# Patient Record
Sex: Female | Born: 1975 | Race: White | Hispanic: No | Marital: Single | Smoking: Never smoker
Health system: Southern US, Community
[De-identification: ages and names within clinical notes are randomized; demographics above are authoritative.]

## PROBLEM LIST (undated history)

## (undated) DIAGNOSIS — F70 Mild intellectual disabilities: Secondary | ICD-10-CM

## (undated) DIAGNOSIS — J302 Other seasonal allergic rhinitis: Secondary | ICD-10-CM

## (undated) HISTORY — DX: Other seasonal allergic rhinitis: J30.2

## (undated) HISTORY — DX: Mild intellectual disabilities: F70

---

## 1998-10-30 ENCOUNTER — Other Ambulatory Visit: Admission: RE | Admit: 1998-10-30 | Discharge: 1998-10-30 | Payer: Self-pay | Admitting: Obstetrics and Gynecology

## 2000-01-09 ENCOUNTER — Other Ambulatory Visit: Admission: RE | Admit: 2000-01-09 | Discharge: 2000-01-09 | Payer: Self-pay | Admitting: Obstetrics and Gynecology

## 2001-04-07 ENCOUNTER — Other Ambulatory Visit: Admission: RE | Admit: 2001-04-07 | Discharge: 2001-04-07 | Payer: Self-pay | Admitting: Obstetrics and Gynecology

## 2002-04-28 ENCOUNTER — Other Ambulatory Visit: Admission: RE | Admit: 2002-04-28 | Discharge: 2002-04-28 | Payer: Self-pay | Admitting: Obstetrics and Gynecology

## 2003-05-04 ENCOUNTER — Other Ambulatory Visit: Admission: RE | Admit: 2003-05-04 | Discharge: 2003-05-04 | Payer: Self-pay | Admitting: Obstetrics and Gynecology

## 2004-05-13 ENCOUNTER — Ambulatory Visit: Payer: Self-pay | Admitting: Family Medicine

## 2004-05-15 ENCOUNTER — Other Ambulatory Visit: Admission: RE | Admit: 2004-05-15 | Discharge: 2004-05-15 | Payer: Self-pay | Admitting: Obstetrics and Gynecology

## 2004-07-08 ENCOUNTER — Ambulatory Visit: Payer: Self-pay | Admitting: Family Medicine

## 2004-08-29 ENCOUNTER — Ambulatory Visit: Payer: Self-pay | Admitting: Family Medicine

## 2004-10-03 ENCOUNTER — Ambulatory Visit: Payer: Self-pay | Admitting: Family Medicine

## 2005-07-24 ENCOUNTER — Ambulatory Visit: Payer: Self-pay | Admitting: Family Medicine

## 2005-12-16 ENCOUNTER — Ambulatory Visit: Payer: Self-pay | Admitting: Family Medicine

## 2006-04-06 ENCOUNTER — Ambulatory Visit: Payer: Self-pay | Admitting: Family Medicine

## 2008-03-21 ENCOUNTER — Ambulatory Visit: Payer: Self-pay | Admitting: Family Medicine

## 2008-03-21 DIAGNOSIS — F70 Mild intellectual disabilities: Secondary | ICD-10-CM | POA: Insufficient documentation

## 2008-08-25 ENCOUNTER — Telehealth: Payer: Self-pay | Admitting: Family Medicine

## 2009-10-26 ENCOUNTER — Ambulatory Visit: Payer: Self-pay | Admitting: Family Medicine

## 2009-10-26 LAB — CONVERTED CEMR LAB
Bilirubin Urine: NEGATIVE
Ketones, urine, test strip: NEGATIVE
Urobilinogen, UA: 0.2

## 2009-10-29 LAB — CONVERTED CEMR LAB
ALT: 9 units/L (ref 0–35)
CO2: 26 meq/L (ref 19–32)
Calcium: 9.1 mg/dL (ref 8.4–10.5)
Chloride: 105 meq/L (ref 96–112)
Cholesterol: 167 mg/dL (ref 0–200)
Creatinine, Ser: 0.72 mg/dL (ref 0.40–1.20)
Eosinophils Absolute: 0.2 10*3/uL (ref 0.0–0.7)
HCT: 36.8 % (ref 36.0–46.0)
Hemoglobin: 12.1 g/dL (ref 12.0–15.0)
Lymphs Abs: 2.1 10*3/uL (ref 0.7–4.0)
MCV: 92.7 fL (ref 78.0–100.0)
Monocytes Relative: 8 % (ref 3–12)
Neutrophils Relative %: 59 % (ref 43–77)
RBC: 3.97 M/uL (ref 3.87–5.11)
Sodium: 139 meq/L (ref 135–145)
Total Protein: 7 g/dL (ref 6.0–8.3)
WBC: 6.8 10*3/uL (ref 4.0–10.5)

## 2010-08-01 NOTE — Letter (Signed)
Summary: Physical Exam Form/[facility]  Physical Exam Form/[facility]   Imported By: Lanelle Bal 10/31/2009 12:37:33  _____________________________________________________________________  External Attachment:    Type:   Image     Comment:   External Document

## 2010-08-01 NOTE — Assessment & Plan Note (Signed)
Summary: YEARLY PHYSICAL FOR CAMP FORM TO BE FILLED OUT/[facility]   Vital Signs:  Patient profile:   35 year old female Height:      63.75 inches Weight:      167.75 pounds BMI:     29.13 Temp:     97.8 degrees F oral Pulse rate:   80 / minute Pulse rhythm:   regular BP sitting:   110 / 76  (left arm) Cuff size:   regular  Vitals Entered By: Lewanda Rife LPN (October 26, 2009 2:40 PM) CC: yearly physical for camp form   History of Present Illness: is doing well overall   still going to [employer]-- is taking some math and reading classes  in prep for eventually getting a job   has been feeling good   not enough exercise- needs to do that   made some changes in diet   wt is up 4 lb  bp good   will be doing a camp this summer - swimming and socializing   periods are a little heavy - not too painful  very regular  not sexually active- has never had gyn exam and is not sure she wants one was married for a short time - is divorced   Allergies (verified): No Known Drug Allergies  Past History:  Past Medical History: Last updated: 03/21/2008 mental retardation- mild  seasonal allergic rhinitis   Social History: Last updated: 10/26/2009 currently lives with mother  non smoker and no alcohol was briefly married  does exercise regularly  student at [employer]  Family History: HTN in family  Social History: currently lives with mother  non smoker and no alcohol was briefly married  does exercise regularly  student at [employer]  Review of Systems General:  Denies fatigue, loss of appetite, and malaise. Eyes:  Denies blurring and eye irritation. CV:  Denies chest pain or discomfort, palpitations, shortness of breath with exertion, and swelling of feet. Resp:  Denies cough and wheezing. GI:  Denies abdominal pain, change in bowel habits, indigestion, loss of appetite, and nausea. GU:  Denies abnormal vaginal bleeding, discharge, dysuria, and urinary frequency. MS:  Denies joint  pain, joint redness, joint swelling, muscle aches, and muscle weakness. Derm:  Denies itching, lesion(s), poor wound healing, and rash. Neuro:  Denies numbness and tingling. Psych:  mood has been fine. Endo:  Denies cold intolerance, excessive thirst, excessive urination, and heat intolerance. Heme:  Denies abnormal bruising, bleeding, and enlarge lymph nodes.  Physical Exam  General:  overweight but generally well appearing  Head:  normocephalic, atraumatic, and no abnormalities observed.   Eyes:  vision grossly intact, pupils equal, pupils round, and pupils reactive to light.  no conjunctival pallor, injection or icterus  Ears:  R ear normal and L ear normal.  - scant cerumen  Nose:  no nasal discharge.   Mouth:  pharynx pink and moist.   Neck:  supple with full rom and no masses or thyromegally, no JVD or carotid bruit  Chest Wall:  No deformities, masses, or tenderness noted. Lungs:  Normal respiratory effort, chest expands symmetrically. Lungs are clear to auscultation, no crackles or wheezes. Heart:  Normal rate and regular rhythm. S1 and S2 normal without gallop, murmur, click, rub or other extra sounds. Abdomen:  Bowel sounds positive,abdomen soft and non-tender without masses, organomegaly or hernias noted. Msk:  No deformity or scoliosis noted of thoracic or lumbar spine.  no acute joint changes  Pulses:  R and L carotid,radial,femoral,dorsalis pedis  and posterior tibial pulses are full and equal bilaterally Extremities:  No clubbing, cyanosis, edema, or deformity noted with normal full range of motion of all joints.   Neurologic:  sensation intact to light touch, gait normal, and DTRs symmetrical and normal.  no tremor  Skin:  Intact without suspicious lesions or rashes few skin tags and brown nevi fair complexion Cervical Nodes:  No lymphadenopathy noted Inguinal Nodes:  No significant adenopathy Psych:  baseline MR  somewhat blunted affect which is her baseline  seems to  be feeling fine    Impression & Recommendations:  Problem # 1:  HEALTH MAINTENANCE EXAM (ICD-V70.0) Assessment Comment Only here with form to fill out for camp  no restrictions anticipated  pend labs - wellness (and hb needed for form) also ua nl (some blood from menses )  disc health habits - could benefit from wt loss  adv 20 min of aerobic exercise at least 5 d per week  I do recommend coming back for gyn andbreast exam and pap - pt has never had one -- she is nervous about this and will think about it  she was married in past- no longer sexually active and no gyn c/o  Orders: Venipuncture (40981) Specimen Handling (19147) T-Comprehensive Metabolic Panel [phone]) T-CBC w/Diff ([id number]) T-TSH (52841-32440) T-Lipid Profile ([id number])  Problem # 2:  SCREENING FOR LIPOID DISORDERS (ICD-V77.91) Assessment: New  will check nonfasting lipid prof today diet is fair-could probably be better  disc sat fats to avoie   Orders: Specimen Handling (40347) T-Comprehensive Metabolic Panel ([id number]) T-CBC w/Diff ([id number]) T-TSH ([id number]) T-Lipid Profile ([id number])  Patient Instructions: 1)  we will do some wellness labs today 2)  start some exercise at least 5 days per week for 20 minutes  3)  I will return your form to you when your labs come back  4)  you should have a gynecologic exam and pap smear in the future- call back to schedule a visit for that   Prior Medications (reviewed today): None Current Allergies (reviewed today): No known allergies  Laboratory Results   Urine Tests  Date/Time Received: October 26, 2009 2:41 PM  Date/Time Reported: October 26, 2009 2:41 PM   Routine Urinalysis   Color: yellow Appearance: Hazy Glucose: negative   (Normal Range: Negative) Bilirubin: negative   (Normal Range: Negative) Ketone: negative   (Normal Range: Negative) Spec. Gravity: 1.020   (Normal Range: 1.003-1.035) Blood: large   (Normal Range:  Negative) pH: 6.0   (Normal Range: 5.0-8.0) Protein: trace   (Normal Range: Negative) Urobilinogen: 0.2   (Normal Range: 0-1) Nitrite: negative   (Normal Range: Negative) Leukocyte Esterace: negative   (Normal Range: Negative)    Comments: Pt is on menstrual period      Tetanus/Td Immunization History:    Tetanus/Td # 1:  Td (03/21/2008)  Influenza Immunization History:    Influenza # 1:  Fluvax 3+ (03/21/2008)

## 2011-07-11 ENCOUNTER — Ambulatory Visit: Payer: Self-pay | Admitting: Family Medicine

## 2011-07-14 ENCOUNTER — Encounter: Payer: Self-pay | Admitting: Family Medicine

## 2011-07-15 ENCOUNTER — Ambulatory Visit (INDEPENDENT_AMBULATORY_CARE_PROVIDER_SITE_OTHER): Payer: Medicaid Other | Admitting: Family Medicine

## 2011-07-15 ENCOUNTER — Encounter: Payer: Self-pay | Admitting: Family Medicine

## 2011-07-15 VITALS — BP 96/64 | HR 80 | Temp 98.5°F | Ht 63.75 in | Wt 165.2 lb

## 2011-07-15 DIAGNOSIS — F70 Mild intellectual disabilities: Secondary | ICD-10-CM

## 2011-07-15 NOTE — Assessment & Plan Note (Signed)
Pt is overall stable - despite going to school is still not able to read well enough to follow directions or ride a regular bus/ public transportation Filled out forms for her to continue using the [employer] bus/ system  Enc good health habits/ diet and exercise also

## 2011-07-15 NOTE — Patient Instructions (Signed)
Stay healthy with diet and exercise  I feel it is medically necessary to keep using the [employer] bus Good luck with school

## 2011-07-15 NOTE — Progress Notes (Signed)
  Subjective:    Patient ID: Caitlin Stevens, female    DOB: 09-Dec-1975, 36 y.o.   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 MENTAL RETARDATION, MILD   Past Medical History  Diagnosis Date  . Mild mental retardation   . Seasonal allergic rhinitis    No past surgical history on file. History  Substance Use Topics  . Smoking status: Never Smoker   . Smokeless tobacco: Not on file  . Alcohol Use: No   Family History  Problem Relation Age of Onset  . Hypertension Other    No Known Allergies No current outpatient prescriptions on file prior to visit.       Review of Systems Review of Systems  Constitutional: Negative for fever, appetite change, fatigue and unexpected weight change.  Eyes: Negative for pain and visual disturbance.  Respiratory: Negative for cough and shortness of breath.   Cardiovascular: Negative for cp or palpitations    Gastrointestinal: Negative for nausea, diarrhea and constipation.  Genitourinary: Negative for urgency and frequency.  Skin: Negative for pallor or rash   Neurological: Negative for weakness, light-headedness, numbness and headaches.  Hematological: Negative for adenopathy. Does not bruise/bleed easily.  Psychiatric/Behavioral: Negative for dysphoric mood. The patient is not nervous/anxious.         Objective:   Physical Exam  Constitutional: She appears well-developed and well-nourished. No distress.       overwt and well appearing     HENT:  Head: Normocephalic and atraumatic.  Right Ear: External ear normal.  Left Ear: External ear normal.  Mouth/Throat: Oropharynx is clear and moist.  Eyes: Conjunctivae and EOM are normal. Pupils are equal, round, and reactive to light.  Neck: Normal range of motion. Neck supple. No JVD present. No thyromegaly present.  Cardiovascular: Normal rate, regular rhythm, normal heart sounds and intact distal pulses.   Pulmonary/Chest: Effort normal and breath sounds normal. No respiratory distress. She has no wheezes.  Abdominal: Soft. Bowel sounds are normal. She exhibits no distension and no mass. There is no tenderness.  Musculoskeletal: Normal range of motion. She exhibits no edema and no tenderness.  Lymphadenopathy:    She has no cervical adenopathy.  Neurological: She is alert. She has normal reflexes. No cranial nerve deficit. She exhibits normal muscle tone. She displays no seizure activity. Coordination normal.  Skin: Skin is warm and dry. No rash noted. No erythema. No pallor.  Psychiatric: She has a normal mood and affect.       Pleasant and answers questions appropriately  Mental retardation noted and is unchanged  Does occ repeat statements / questions           Assessment & Plan:

## 2011-09-10 ENCOUNTER — Encounter (HOSPITAL_COMMUNITY): Payer: Self-pay | Admitting: *Deleted

## 2011-09-10 ENCOUNTER — Emergency Department (HOSPITAL_COMMUNITY)
Admission: EM | Admit: 2011-09-10 | Discharge: 2011-09-10 | Disposition: A | Discharge status: Home / Self Care | Payer: Medicaid Other | Source: Home / Self Care | Attending: Emergency Medicine | Admitting: Emergency Medicine

## 2011-09-10 DIAGNOSIS — J069 Acute upper respiratory infection, unspecified: Secondary | ICD-10-CM

## 2011-09-10 MED ORDER — CETIRIZINE-PSEUDOEPHEDRINE ER 5-120 MG PO TB12: 1.0000 | ORAL_TABLET | Freq: Every day | ORAL | Status: AC

## 2011-09-10 MED ORDER — ACETAMINOPHEN-CODEINE #3 300-30 MG PO TABS: 1.0000 | ORAL_TABLET | Freq: Four times a day (QID) | ORAL | Status: AC | PRN

## 2011-09-10 NOTE — ED Provider Notes (Signed)
History     CSN: [id number]  Arrival date & time 09/10/11  1055   First MD Initiated Contact with Patient 09/10/11 1120      Chief Complaint  Patient presents with  . URI    (Consider location/radiation/quality/duration/timing/severity/associated sxs/prior treatment) HPI Comments: Patient presents urgent care with cold-like symptoms for 3 days, mild congestion, dry cough sporadic, no shortness of breath, tactile fevers at home. Comfortably, no shortness of breath and no gastrointestinal symptoms. Patient tried with over-the-counter medicine for cold symptoms.  Patient is a 36 y.o. female presenting with URI. The history is provided by the patient.  URI The primary symptoms include fever, cough and abdominal pain. Primary symptoms do not include fatigue, sore throat, swollen glands, wheezing, nausea, vomiting, myalgias, arthralgias or rash. The current episode started 2 days ago. This is a new problem.  Symptoms associated with the illness include congestion and rhinorrhea. The illness is not associated with chills or sinus pressure.    Past Medical History  Diagnosis Date  . Mild mental retardation   . Seasonal allergic rhinitis     History reviewed. No pertinent past surgical history.  Family History  Problem Relation Age of Onset  . Hypertension Other     History  Substance Use Topics  . Smoking status: Never Smoker   . Smokeless tobacco: Not on file  . Alcohol Use: No    OB History    Grav Para Term Preterm Abortions TAB SAB Ect Mult Living                  Review of Systems  Constitutional: Positive for fever. Negative for chills and fatigue.  HENT: Positive for congestion and rhinorrhea. Negative for sore throat and sinus pressure.   Eyes: Negative for pain.  Respiratory: Positive for cough. Negative for wheezing.   Gastrointestinal: Positive for abdominal pain. Negative for nausea and vomiting.  Genitourinary: Negative for urgency and frequency.    Musculoskeletal: Negative for myalgias and arthralgias.  Skin: Negative for rash.    Allergies  Review of patient's allergies indicates no known allergies.  Home Medications   Current Outpatient Rx  Name Route Sig Dispense Refill  . ACETAMINOPHEN-CODEINE #3 300-30 MG PO TABS Oral Take 1-2 tablets by mouth every 6 (six) hours as needed for pain. 15 tablet 0  . CETIRIZINE-PSEUDOEPHEDRINE ER 5-120 MG PO TB12 Oral Take 1 tablet by mouth daily. 30 tablet 0    BP 116/76  Pulse 88  Temp(Src) 98.7 F (37.1 C) (Oral)  Resp 19  SpO2 98%  LMP 08/27/2011  Physical Exam  Nursing note and vitals reviewed. Constitutional: She appears well-developed and well-nourished. No distress.  HENT:  Head: Normocephalic.  Right Ear: Tympanic membrane normal.  Left Ear: Tympanic membrane normal.  Mouth/Throat: Uvula is midline, oropharynx is clear and moist and mucous membranes are normal.  Eyes: Conjunctivae are normal. Right eye exhibits no discharge. Left eye exhibits no discharge.  Neck: Neck supple. No JVD present.  Cardiovascular: Normal rate.   No murmur heard. Pulmonary/Chest: Effort normal. No respiratory distress. She has no decreased breath sounds. She has no wheezes. She has no rhonchi. She has no rales. She exhibits no tenderness.  Abdominal: Soft. She exhibits no distension. There is no tenderness. There is no guarding.  Lymphadenopathy:    She has no cervical adenopathy.  Neurological: She is alert.  Skin: Skin is warm. No erythema.    ED Course  Procedures (including critical care time)  Labs Reviewed -  No data to display No results found.   1. Upper respiratory infection       MDM  URI upper respiratory infection with normal exam minimally symptomatic.        Jimmie Molly, MD 09/10/11 1245

## 2011-09-10 NOTE — Discharge Instructions (Signed)
Antibiotic Nonuse  Your caregiver felt that the infection or problem was not one that would be helped with an antibiotic. Infections may be caused by viruses or bacteria. Only a caregiver can tell which one of these is the likely cause of an illness. A cold is the most common cause of infection in both adults and children. A cold is a virus. Antibiotic treatment will have no effect on a viral infection. Viruses can lead to many lost days of work caring for sick children and many missed days of school. Children may catch as many as 10 "colds" or "flus" per year during which they can be tearful, cranky, and uncomfortable. The goal of treating a virus is aimed at keeping the ill person comfortable. Antibiotics are medications used to help the body fight bacterial infections. There are relatively few types of bacteria that cause infections but there are hundreds of viruses. While both viruses and bacteria cause infection they are very different types of germs. A viral infection will typically go away by itself within 7 to 10 days. Bacterial infections may spread or get worse without antibiotic treatment. Examples of bacterial infections are:  Sore throats (like strep throat or tonsillitis).   Infection in the lung (pneumonia).   Ear and skin infections.  Examples of viral infections are:  Colds or flus.   Most coughs and bronchitis.   Sore throats not caused by Strep.   Runny noses.  It is often best not to take an antibiotic when a viral infection is the cause of the problem. Antibiotics can kill off the helpful bacteria that we have inside our body and allow harmful bacteria to start growing. Antibiotics can cause side effects such as allergies, nausea, and diarrhea without helping to improve the symptoms of the viral infection. Additionally, repeated uses of antibiotics can cause bacteria inside of our body to become resistant. That resistance can be passed onto harmful bacterial. The next time  you have an infection it may be harder to treat if antibiotics are used when they are not needed. Not treating with antibiotics allows our own immune system to develop and take care of infections more efficiently. Also, antibiotics will work better for us when they are prescribed for bacterial infections. Treatments for a child that is ill may include:  Give extra fluids throughout the day to stay hydrated.   Get plenty of rest.   Only give your child over-the-counter or prescription medicines for pain, discomfort, or fever as directed by your caregiver.   The use of a cool mist humidifier may help stuffy noses.   Cold medications if suggested by your caregiver.  Your caregiver may decide to start you on an antibiotic if:  The problem you were seen for today continues for a longer length of time than expected.   You develop a secondary bacterial infection.  SEEK MEDICAL CARE IF:  Fever lasts longer than 5 days.   Symptoms continue to get worse after 5 to 7 days or become severe.   Difficulty in breathing develops.   Signs of dehydration develop (poor drinking, rare urinating, dark colored urine).   Changes in behavior or worsening tiredness (listlessness or lethargy).  Document Released: 08/25/2001 Document Revised: 06/05/2011 Document Reviewed: 02/21/2009 ExitCare Patient Information 2012 [employer]. 

## 2011-09-10 NOTE — ED Notes (Signed)
Pt  States  She  Has  A  Cold    -  She  Reports  Symptoms  X  3  Days  -  She  Is  Sitting upright on exam table  And  Appears  In no  Acute  Distress     She is  In a  pvt room and is  Masked  -  She  Is  Speaking in complete  sentances     familymember is  At the  Bedside

## 2014-09-06 ENCOUNTER — Telehealth: Payer: Self-pay | Admitting: Family Medicine

## 2014-09-06 ENCOUNTER — Ambulatory Visit: Payer: Medicaid Other | Admitting: Family Medicine

## 2014-09-06 NOTE — Telephone Encounter (Signed)
Patient hasn't been seen in 3 years.  Patient's friend said patient doesn't come to the doctor unless she has to come. Patient needs an appointment to get a form filled out for her to use the [employer] [facility].  Will you still see the patient or should I make an appointment with Allie Bossier to see her?  Please advise.

## 2014-09-06 NOTE — Telephone Encounter (Signed)
I can see her but please put her in a 30 min slot thanks

## 2014-09-12 ENCOUNTER — Ambulatory Visit: Payer: Medicaid Other | Admitting: Family Medicine

## 2014-09-15 ENCOUNTER — Ambulatory Visit: Payer: Medicaid Other | Admitting: Family Medicine

## 2014-09-18 ENCOUNTER — Ambulatory Visit: Payer: Medicaid Other | Admitting: Family Medicine

## 2014-11-07 ENCOUNTER — Other Ambulatory Visit (HOSPITAL_COMMUNITY)
Admission: RE | Admit: 2014-11-07 | Discharge: 2014-11-07 | Disposition: A | Discharge status: Home / Self Care | Payer: Medicaid Other | Source: Ambulatory Visit | Attending: Family Medicine | Admitting: Family Medicine

## 2014-11-07 DIAGNOSIS — Z1151 Encounter for screening for human papillomavirus (HPV): Secondary | ICD-10-CM | POA: Diagnosis present

## 2014-11-07 DIAGNOSIS — Z01419 Encounter for gynecological examination (general) (routine) without abnormal findings: Secondary | ICD-10-CM | POA: Insufficient documentation

## 2015-10-25 ENCOUNTER — Other Ambulatory Visit (HOSPITAL_COMMUNITY)
Admission: RE | Admit: 2015-10-25 | Discharge: 2015-10-25 | Disposition: A | Discharge status: Home / Self Care | Payer: Medicaid Other | Source: Ambulatory Visit | Attending: Family Medicine | Admitting: Family Medicine

## 2015-10-25 DIAGNOSIS — Z01411 Encounter for gynecological examination (general) (routine) with abnormal findings: Secondary | ICD-10-CM | POA: Diagnosis present

## 2015-10-25 DIAGNOSIS — Z1151 Encounter for screening for human papillomavirus (HPV): Secondary | ICD-10-CM | POA: Diagnosis not present

## 2016-12-02 DIAGNOSIS — Z793 Long term (current) use of hormonal contraceptives: Secondary | ICD-10-CM | POA: Diagnosis not present

## 2016-12-02 DIAGNOSIS — F79 Unspecified intellectual disabilities: Secondary | ICD-10-CM | POA: Diagnosis not present

## 2017-03-05 DIAGNOSIS — E782 Mixed hyperlipidemia: Secondary | ICD-10-CM | POA: Diagnosis not present

## 2017-03-05 DIAGNOSIS — Z793 Long term (current) use of hormonal contraceptives: Secondary | ICD-10-CM | POA: Diagnosis not present

## 2017-04-25 DIAGNOSIS — Z23 Encounter for immunization: Secondary | ICD-10-CM | POA: Diagnosis not present

## 2017-07-15 ENCOUNTER — Other Ambulatory Visit: Payer: Self-pay

## 2017-07-15 ENCOUNTER — Ambulatory Visit (HOSPITAL_COMMUNITY)
Admission: EM | Admit: 2017-07-15 | Discharge: 2017-07-15 | Disposition: A | Discharge status: Home / Self Care | Payer: Medicare Other | Attending: Family Medicine | Admitting: Family Medicine

## 2017-07-15 ENCOUNTER — Encounter (HOSPITAL_COMMUNITY): Payer: Self-pay | Admitting: Emergency Medicine

## 2017-07-15 ENCOUNTER — Ambulatory Visit (INDEPENDENT_AMBULATORY_CARE_PROVIDER_SITE_OTHER): Payer: Medicare Other

## 2017-07-15 DIAGNOSIS — S66811A Strain of other specified muscles, fascia and tendons at wrist and hand level, right hand, initial encounter: Secondary | ICD-10-CM

## 2017-07-15 DIAGNOSIS — S60031A Contusion of right middle finger without damage to nail, initial encounter: Secondary | ICD-10-CM | POA: Diagnosis not present

## 2017-07-15 DIAGNOSIS — S60942A Unspecified superficial injury of right middle finger, initial encounter: Secondary | ICD-10-CM

## 2017-07-15 DIAGNOSIS — S66302A Unspecified injury of extensor muscle, fascia and tendon of right middle finger at wrist and hand level, initial encounter: Secondary | ICD-10-CM | POA: Diagnosis not present

## 2017-07-15 DIAGNOSIS — S6991XA Unspecified injury of right wrist, hand and finger(s), initial encounter: Secondary | ICD-10-CM | POA: Diagnosis not present

## 2017-07-15 NOTE — ED Triage Notes (Addendum)
Right hand injury, right middle finger injured Monday night after jamming her finger into couch.  No pain, but unable to straighten finger

## 2017-07-15 NOTE — Discharge Instructions (Signed)
Continue to wear finger splint until you follow up with a hand surgeon.

## 2017-07-18 NOTE — ED Provider Notes (Signed)
  [place]   [id number] 07/15/17 Arrival Time: 1625  ASSESSMENT & PLAN:  1. Injury of finger of right hand, initial encounter   2. Extensor tendon rupture of hand, right, initial encounter     Imaging: Dg Finger Middle Right  Result Date: 07/15/2017 CLINICAL DATA:  Right middle finger pain and bruising secondary to trauma 3 days ago. EXAM: RIGHT MIDDLE FINGER 2+V COMPARISON:  None. FINDINGS: There is no evidence of fracture or dislocation. There is no evidence of arthropathy or other focal bone abnormality. Soft tissues are unremarkable. IMPRESSION: Negative. Electronically Signed   By: Lorriane Shire M.D.   On: 07/15/2017 17:05   Will need to see a hand surgeon to evaluate probable extensor tendon rupture of finger. She will schedule. Finger splinted in extension. OTC analgesics as needed.  Reviewed expectations re: course of current medical issues. Questions answered. Outlined signs and symptoms indicating need for more acute intervention. Patient verbalized understanding. After Visit Summary given.  SUBJECTIVE: History from: patient. Caitlin Stevens is a 42 y.o. female who reports:  Pain/Discomfort of: right distal 3rd finger Onset abrupt, approximately 2 days ago Injury/trama: yes, "jammed my finger against the couch" Describes as: localized mild discomfort at DIP along with inability to extend distal portion of finger Progression: no worsneing No specific aggravating or alleviating factors reported. Associated symptoms: none reported Extremity sensation changes or weakness: none. Self treatment: none  History of similar: no  ROS: As per HPI.   OBJECTIVE:  Vitals:   07/15/17 1647  BP: 132/89  Pulse: 83  Resp: 18  Temp: 98.4 F (36.9 C)  TempSrc: Oral  SpO2: 100%    General appearance: alert; no distress Extremities: no cyanosis or edema; symmetrical with no gross deformities; mild tenderness of her R 3rd DIP joing with no swelling and no  bruising; ROM: inability to extend from DIP; flexion normal CV: normal extremity capillary refill Skin: warm and dry Neurologic: normal gait; normal symmetric reflexes in all extremities; normal sensation in all extremities Psychological: alert and cooperative; normal mood and affect  No Known Allergies  Past Medical History:  Diagnosis Date  . Mild mental retardation   . Seasonal allergic rhinitis    Social History   Socioeconomic History  . Marital status: Single    Spouse name: Not on file  . Number of children: Not on file  . Years of education: Not on file  . Highest education level: Not on file  Social Needs  . Financial resource strain: Not on file  . Food insecurity - worry: Not on file  . Food insecurity - inability: Not on file  . Transportation needs - medical: Not on file  . Transportation needs - non-medical: Not on file  Occupational History  . Not on file  Tobacco Use  . Smoking status: Never Smoker  Substance and Sexual Activity  . Alcohol use: No  . Drug use: Not on file  . Sexual activity: Not on file  Other Topics Concern  . Not on file  Social History Narrative   Currently lives with mother   Was briefly married   Does exercise regularly   Student at [employer]   Family History  Problem Relation Age of Onset  . Hypertension Other       Vanessa Kick, MD 07/18/17 1340

## 2017-09-01 DIAGNOSIS — Z01419 Encounter for gynecological examination (general) (routine) without abnormal findings: Secondary | ICD-10-CM | POA: Diagnosis not present

## 2017-09-01 DIAGNOSIS — Z0001 Encounter for general adult medical examination with abnormal findings: Secondary | ICD-10-CM | POA: Diagnosis not present

## 2017-09-01 DIAGNOSIS — Z793 Long term (current) use of hormonal contraceptives: Secondary | ICD-10-CM | POA: Diagnosis not present

## 2017-09-01 DIAGNOSIS — F79 Unspecified intellectual disabilities: Secondary | ICD-10-CM | POA: Diagnosis not present

## 2017-09-10 DIAGNOSIS — M20011 Mallet finger of right finger(s): Secondary | ICD-10-CM | POA: Diagnosis not present

## 2017-09-10 DIAGNOSIS — M79644 Pain in right finger(s): Secondary | ICD-10-CM | POA: Diagnosis not present

## 2018-05-26 DIAGNOSIS — Z23 Encounter for immunization: Secondary | ICD-10-CM | POA: Diagnosis not present

## 2018-06-29 DIAGNOSIS — F79 Unspecified intellectual disabilities: Secondary | ICD-10-CM | POA: Diagnosis not present

## 2018-06-29 DIAGNOSIS — R7309 Other abnormal glucose: Secondary | ICD-10-CM | POA: Diagnosis not present

## 2018-06-29 DIAGNOSIS — Z6827 Body mass index (BMI) 27.0-27.9, adult: Secondary | ICD-10-CM | POA: Diagnosis not present

## 2018-06-29 DIAGNOSIS — R42 Dizziness and giddiness: Secondary | ICD-10-CM | POA: Diagnosis not present

## 2018-06-29 DIAGNOSIS — Z131 Encounter for screening for diabetes mellitus: Secondary | ICD-10-CM | POA: Diagnosis not present

## 2018-06-29 DIAGNOSIS — E782 Mixed hyperlipidemia: Secondary | ICD-10-CM | POA: Diagnosis not present

## 2019-03-03 IMAGING — DX DG FINGER MIDDLE 2+V*R*
3 series · 3 of 3 positions shown · non-contrast
Comparison: None.

CLINICAL DATA: Right middle finger pain and bruising secondary to
trauma 3 days ago.

EXAM:
RIGHT MIDDLE FINGER 2+V

[finger ap]
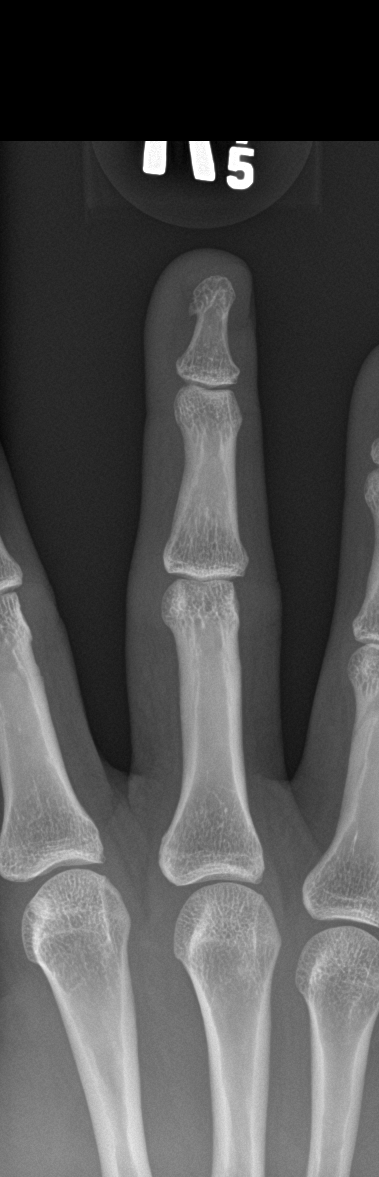

[finger obl]
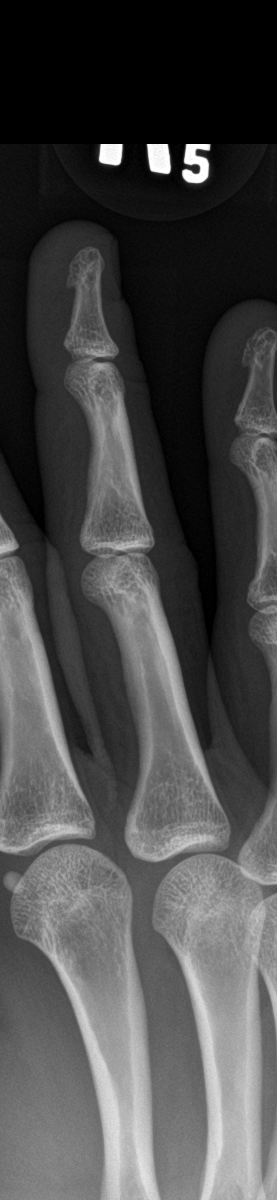

[finger lat]
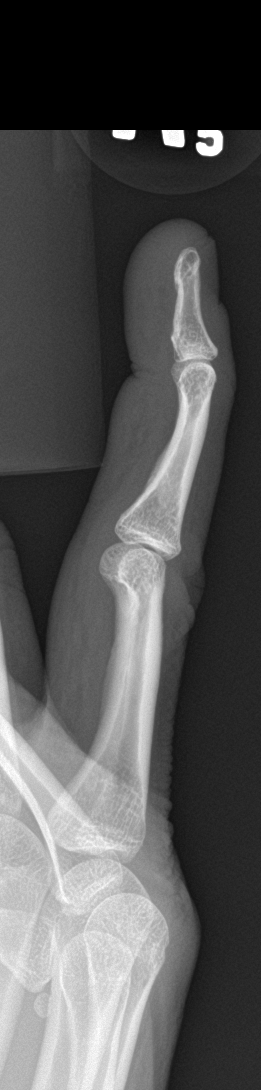

[3 of 3 positions shown; findings below may reference images not displayed]

FINDINGS: There is no evidence of fracture or dislocation. There is no
evidence of arthropathy or other focal bone abnormality. Soft
tissues are unremarkable.
IMPRESSION: Negative.

## 2019-04-12 DIAGNOSIS — Z23 Encounter for immunization: Secondary | ICD-10-CM | POA: Diagnosis not present

## 2019-10-13 ENCOUNTER — Other Ambulatory Visit (HOSPITAL_COMMUNITY)
Admission: RE | Admit: 2019-10-13 | Discharge: 2019-10-13 | Disposition: A | Discharge status: Home / Self Care | Payer: Medicare Other | Source: Ambulatory Visit | Attending: Family Medicine | Admitting: Family Medicine

## 2019-10-13 ENCOUNTER — Other Ambulatory Visit: Payer: Self-pay | Admitting: Family Medicine

## 2019-10-13 DIAGNOSIS — F79 Unspecified intellectual disabilities: Secondary | ICD-10-CM | POA: Diagnosis not present

## 2019-10-13 DIAGNOSIS — Z0001 Encounter for general adult medical examination with abnormal findings: Secondary | ICD-10-CM | POA: Diagnosis not present

## 2019-10-13 DIAGNOSIS — Z1151 Encounter for screening for human papillomavirus (HPV): Secondary | ICD-10-CM | POA: Diagnosis not present

## 2019-10-13 DIAGNOSIS — Z124 Encounter for screening for malignant neoplasm of cervix: Secondary | ICD-10-CM | POA: Diagnosis not present

## 2019-10-13 DIAGNOSIS — D259 Leiomyoma of uterus, unspecified: Secondary | ICD-10-CM | POA: Diagnosis not present

## 2019-10-18 LAB — CYTOLOGY - PAP
Comment: NEGATIVE
Diagnosis: NEGATIVE
High risk HPV: NEGATIVE

## 2019-11-14 DIAGNOSIS — F79 Unspecified intellectual disabilities: Secondary | ICD-10-CM | POA: Diagnosis not present

## 2019-11-14 DIAGNOSIS — D250 Submucous leiomyoma of uterus: Secondary | ICD-10-CM | POA: Diagnosis not present

## 2019-12-28 ENCOUNTER — Other Ambulatory Visit: Payer: Self-pay | Admitting: Family Medicine

## 2019-12-28 DIAGNOSIS — D250 Submucous leiomyoma of uterus: Secondary | ICD-10-CM

## 2020-01-10 ENCOUNTER — Ambulatory Visit
Admission: RE | Admit: 2020-01-10 | Discharge: 2020-01-10 | Disposition: A | Discharge status: Home / Self Care | Payer: Medicare Other | Source: Ambulatory Visit | Attending: Family Medicine | Admitting: Family Medicine

## 2020-01-10 DIAGNOSIS — D250 Submucous leiomyoma of uterus: Secondary | ICD-10-CM

## 2020-01-10 DIAGNOSIS — N854 Malposition of uterus: Secondary | ICD-10-CM | POA: Diagnosis not present

## 2020-04-14 DIAGNOSIS — Z23 Encounter for immunization: Secondary | ICD-10-CM | POA: Diagnosis not present

## 2021-01-05 DIAGNOSIS — U071 COVID-19: Secondary | ICD-10-CM | POA: Diagnosis not present

## 2021-01-06 DIAGNOSIS — U071 COVID-19: Secondary | ICD-10-CM | POA: Diagnosis not present

## 2021-01-30 ENCOUNTER — Ambulatory Visit
Admission: EM | Admit: 2021-01-30 | Discharge: 2021-01-30 | Disposition: A | Discharge status: Home / Self Care | Payer: Medicare Other | Attending: Family Medicine | Admitting: Family Medicine

## 2021-01-30 ENCOUNTER — Other Ambulatory Visit: Payer: Self-pay

## 2021-01-30 DIAGNOSIS — H1031 Unspecified acute conjunctivitis, right eye: Secondary | ICD-10-CM

## 2021-01-30 MED ORDER — POLYMYXIN B-TRIMETHOPRIM 10000-0.1 UNIT/ML-% OP SOLN: 1.0000 [drp] | Freq: Four times a day (QID) | OPHTHALMIC | 0 refills | Status: AC

## 2021-01-30 NOTE — ED Triage Notes (Signed)
Pt c/o right eye irritation that started Saturday. States clear drainage and a stinging pain. Has tried eye drops and ceterizine at home without relief.

## 2021-01-30 NOTE — ED Provider Notes (Signed)
[facility]    CSN: [id number] Arrival date & time: 01/30/21  0802      History   Chief Complaint Chief Complaint  Patient presents with   right eye irritation    HPI Caitlin Stevens is a 45 y.o. female.   Patient presenting today with 4-day history of right eye irritation, thick drainage.  States it was getting a little bit better but it kind of comes and goes.  Denies significant headache, vision changes, nausea, vomiting, injury to the eye, new lotions or products.  So far trying antihistamine drops with minimal relief.  No known sick contacts recently.  No known chronic eye issues.   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 No pertinent surgical history.  OB History   No obstetric history on file.      Home Medications    Prior to Admission medications   Medication Sig Start Date End Date Taking? Authorizing Provider  trimethoprim-polymyxin b (POLYTRIM) ophthalmic solution Place 1 drop into the right eye every 6 (six) hours. 01/30/21  Yes Volney American, PA-C    Family History Family History  Problem Relation Age of Onset   Hypertension Other     Social History Social History   Tobacco Use   Smoking status: Never   Smokeless tobacco: Never  Substance Use Topics   Alcohol use: No   Drug use: Never     Allergies   Patient has no known allergies.   Review of Systems Review of Systems Per HPI  Physical Exam Triage Vital Signs ED Triage Vitals [01/30/21 0815]  Enc Vitals Group     BP 122/78     Pulse Rate 82     Resp 18     Temp 98.1 F (36.7 C)     Temp Source Oral     SpO2 97 %     Weight      Height      Head Circumference      Peak Flow      Pain Score 3     Pain Loc      Pain Edu?      Excl. in [place]?    No data found.  Updated Vital  Signs BP 122/78 (BP Location: Left Arm)   Pulse 82   Temp 98.1 F (36.7 C) (Oral)   Resp 18   LMP  (LMP Unknown)   SpO2 97%   Visual Acuity -unable to obtain visual acuity today due to chronic mild cognitive deficits, patient adamantly declines vision changes Right Eye Distance:   Left Eye Distance:   Bilateral Distance:    Right Eye Near:   Left Eye Near:    Bilateral Near:     Physical Exam Vitals and nursing note reviewed.  Constitutional:      Appearance: Normal appearance. She is not ill-appearing.  HENT:     Head: Atraumatic.     Mouth/Throat:     Mouth: Mucous membranes are moist.  Eyes:     General:        Right eye: Discharge present.        Left eye: No discharge.     Extraocular Movements: Extraocular movements intact.     Pupils: Pupils are equal, round, and reactive to light.  Comments: Mild erythema and injection diffusely across right conjunctiva.  No foreign body noted on exam, crusting along eyelids  Cardiovascular:     Rate and Rhythm: Normal rate and regular rhythm.     Heart sounds: Normal heart sounds.  Pulmonary:     Effort: Pulmonary effort is normal.     Breath sounds: Normal breath sounds.  Musculoskeletal:        General: Normal range of motion.     Cervical back: Normal range of motion and neck supple.  Skin:    General: Skin is warm and dry.  Neurological:     Mental Status: She is alert.     Motor: No weakness.     Gait: Gait normal.  Psychiatric:        Mood and Affect: Mood normal.        Thought Content: Thought content normal.        Judgment: Judgment normal.     UC Treatments / Results  Labs (all labs ordered are listed, but only abnormal results are displayed) Labs Reviewed - No data to display  EKG   Radiology No results found.  Procedures Procedures (including critical care time)  Medications Ordered in UC Medications - No data to display  Initial Impression / Assessment and Plan / UC Course  I have  reviewed the triage vital signs and the nursing notes.  Pertinent labs & imaging results that were available during my care of the patient were reviewed by me and considered in my medical decision making (see chart for details).     Suspect bacterial infection, will treat with Polytrim drops, warm compresses, over-the-counter pain relievers as needed.  Follow-up with eye specialist if worsening or not resolving.  Final Clinical Impressions(s) / UC Diagnoses   Final diagnoses:  Acute bacterial conjunctivitis of right eye   Discharge Instructions   None    ED Prescriptions     Medication Sig Dispense Auth. Provider   trimethoprim-polymyxin b (POLYTRIM) ophthalmic solution Place 1 drop into the right eye every 6 (six) hours. 10 mL Volney American, [place]      PDMP not reviewed this encounter.   Merrie Roof [place], [place] 01/30/21 [phone]

## 2021-04-03 DIAGNOSIS — Z23 Encounter for immunization: Secondary | ICD-10-CM | POA: Diagnosis not present

## 2021-08-28 IMAGING — US US PELVIS COMPLETE WITH TRANSVAGINAL
1 series · 14 of 25 positions shown · non-contrast
Comparison: None

CLINICAL DATA: Initial evaluation for submucosal fibroid.



[Series 1: us pelvis complete with transvaginal · 0.23mm/px · 14 of 68 slices shown]
[im 1/68]
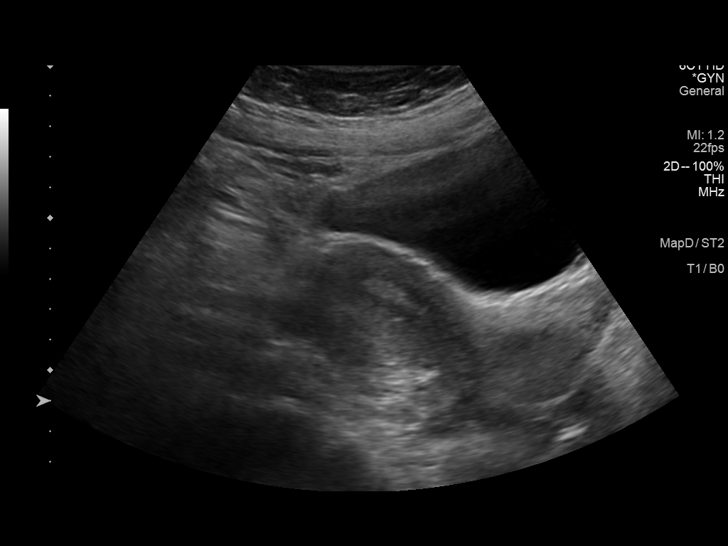
[im 6/68]
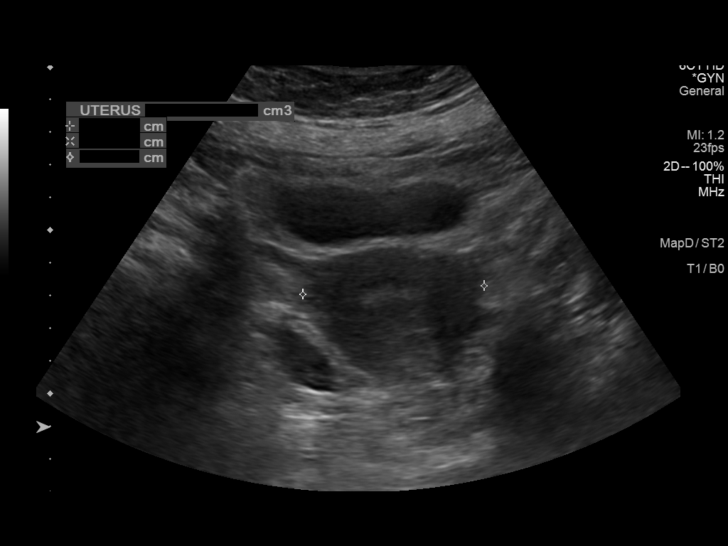
[im 12/68]
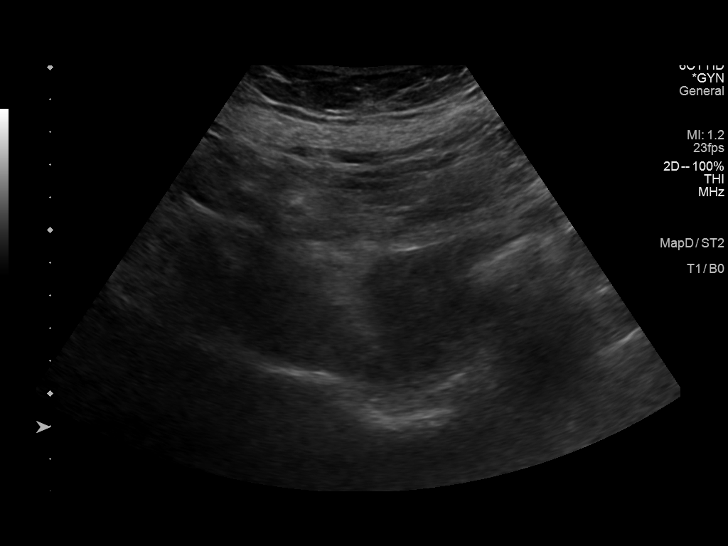
[im 17/68]
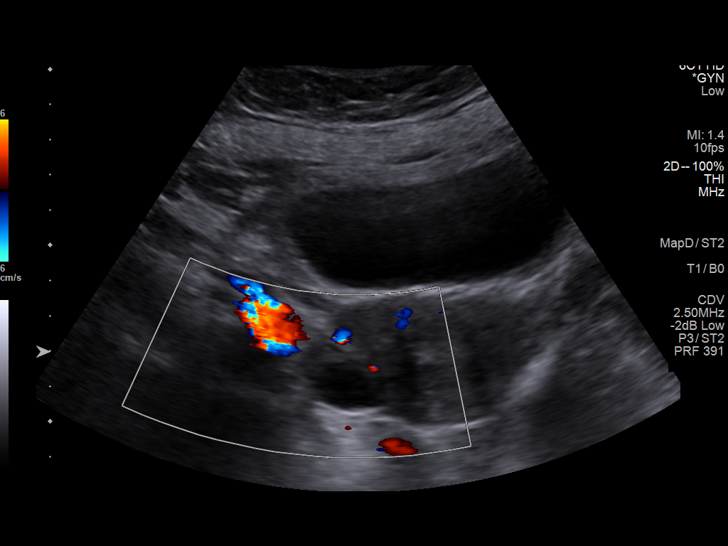
[im 23/68]
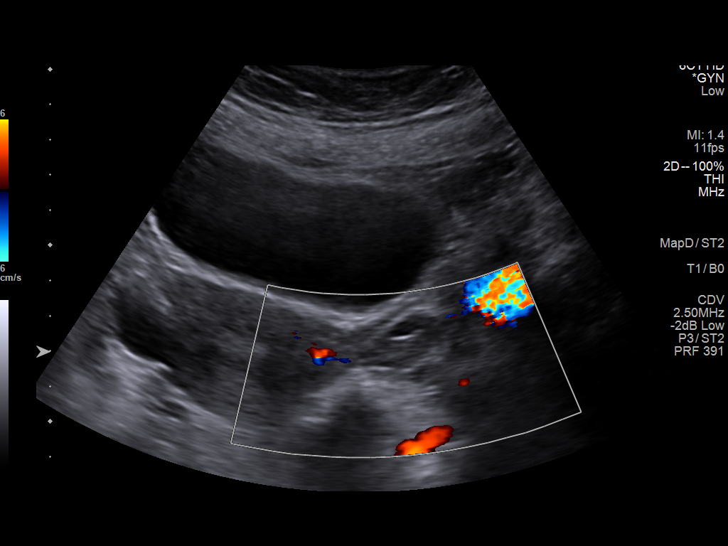
[im 26/68]
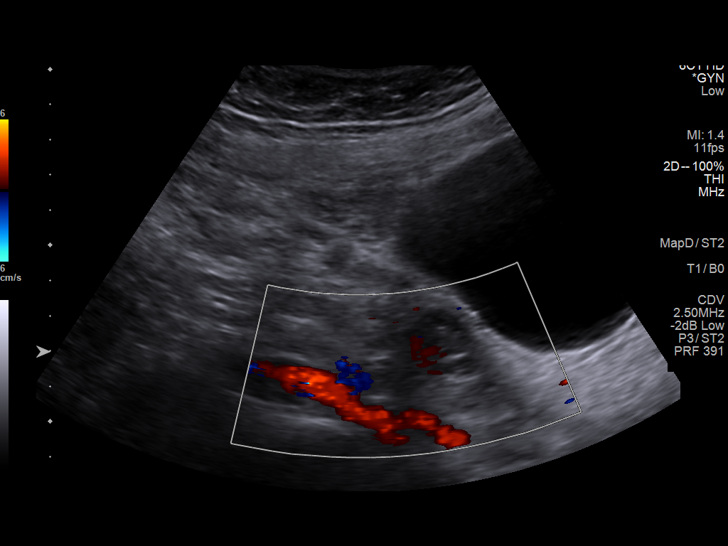
[im 31/68]
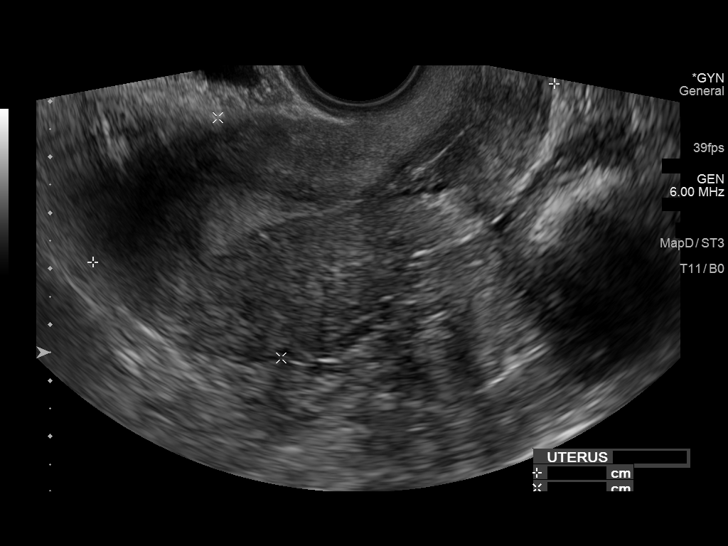
[im 37/68]
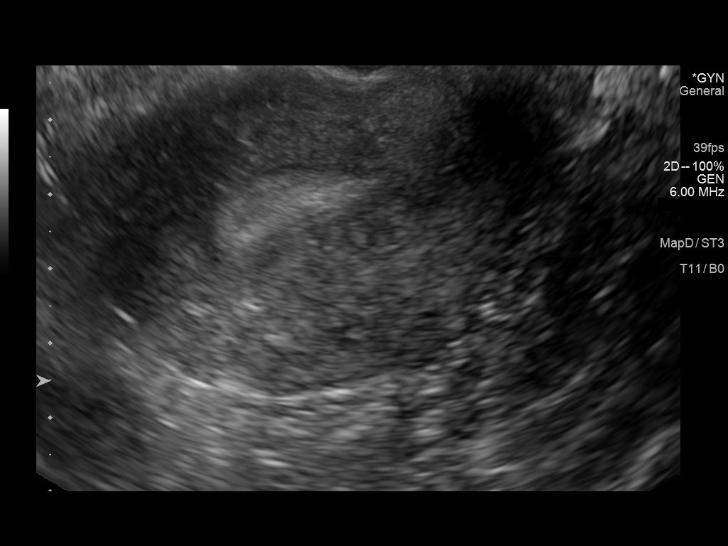
[im 42/68]
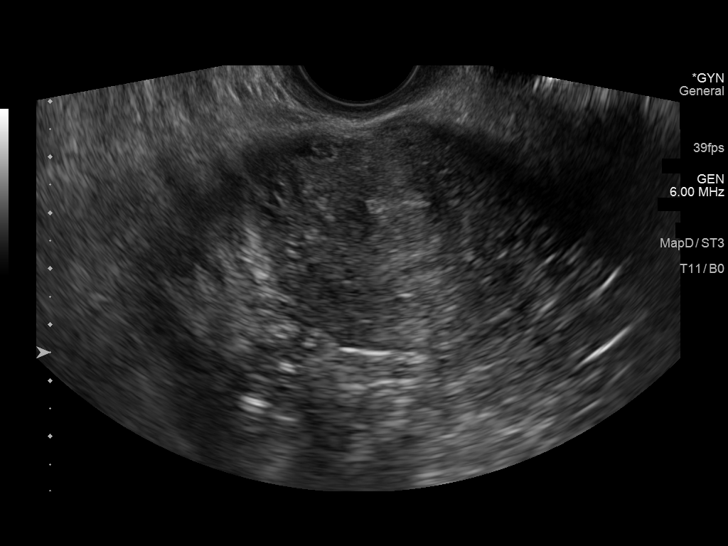
[im 45/68]
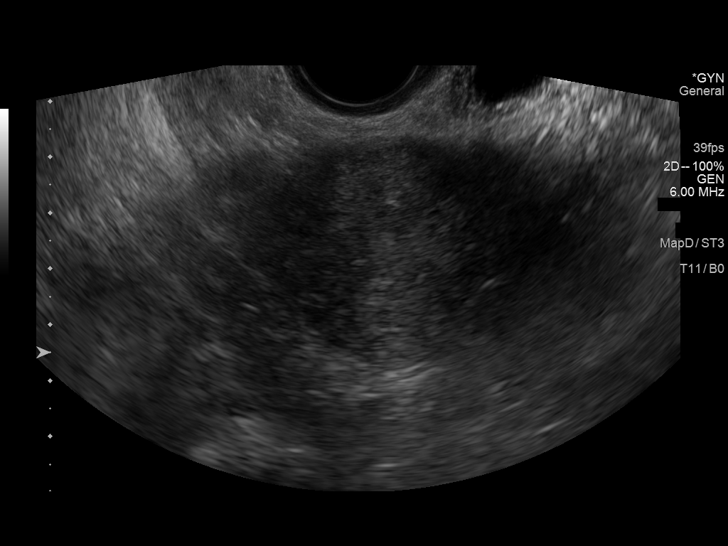
[im 51/68]
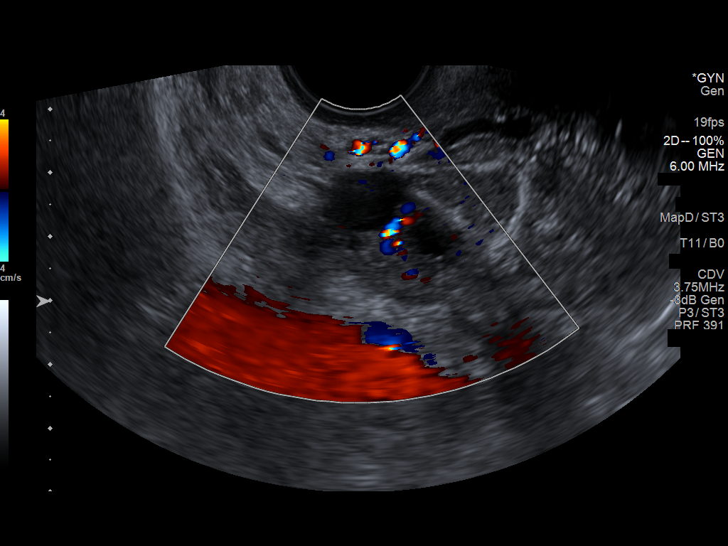
[im 56/68]
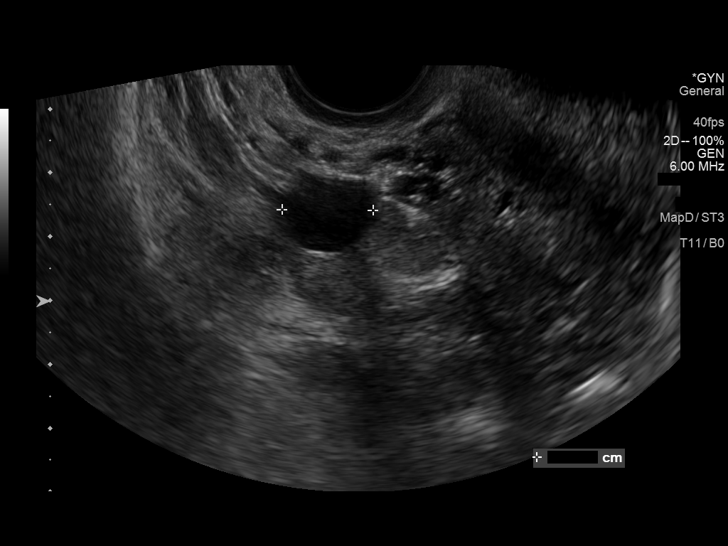
[im 62/68]
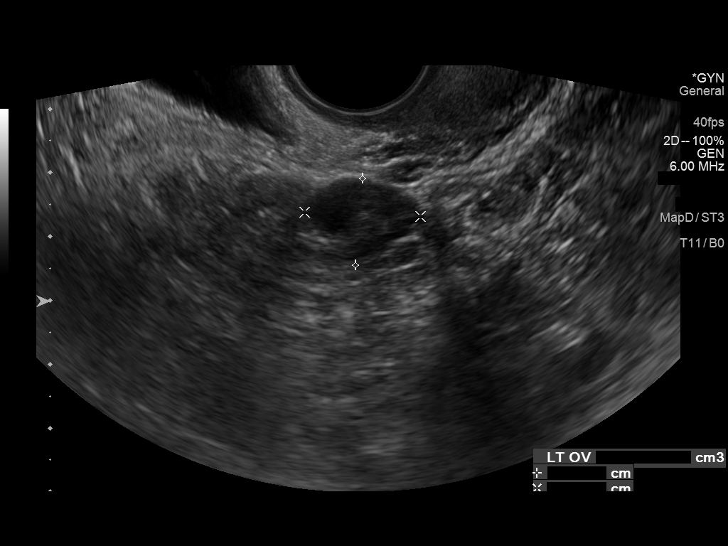
[im 68/68]
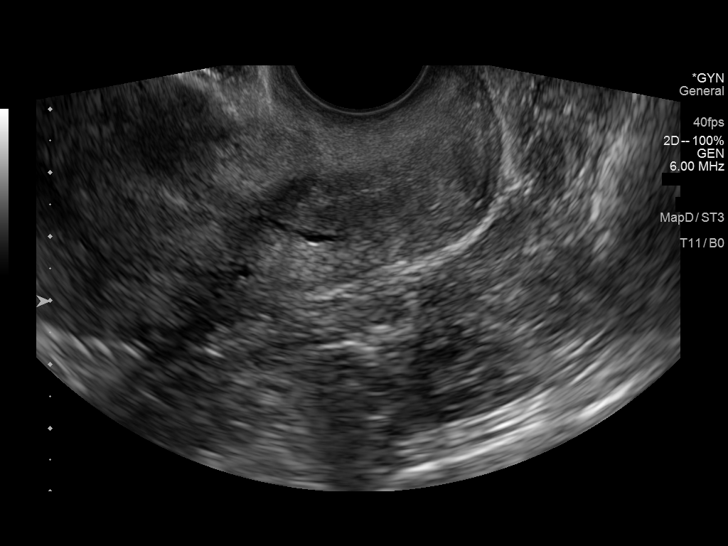

[14 of 25 positions shown; findings below may reference images not displayed]

FINDINGS: Uterus

Measurements: 9.3 x 4.0 x 5.6 cm = volume: 109 mL. Uterus is
anteverted. No discrete fibroids or other mass visualized.

Endometrium

Thickness: 9.5 mm.  No focal abnormality visualized.

Right ovary

Measurements: 3.0 x 2.2 x 2.7 cm = volume: 9.7 mL. Normal
appearance/no adnexal mass.

Left ovary

Measurements: 1.8 x 1.4 x 3.0 cm = volume: 3.5 mL. Normal
appearance/no adnexal mass.

Other findings

No abnormal free fluid.
IMPRESSION: Normal pelvic ultrasound. No discrete fibroid or other abnormality
identified.

## 2022-04-18 DIAGNOSIS — Z23 Encounter for immunization: Secondary | ICD-10-CM | POA: Diagnosis not present
